# Patient Record
Sex: Female | Born: 2000 | Race: White | Hispanic: No | Marital: Single | Smoking: Never smoker
Health system: Southern US, Community
[De-identification: ages and names within clinical notes are randomized; demographics above are authoritative.]

## PROBLEM LIST (undated history)

## (undated) HISTORY — PX: TONSILLECTOMY: SUR1361

---
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
## 2022-04-04 IMAGING — MR MR HEAD WO/W CM
13 series · 48 of 48 positions shown · IV contrast (gadavist)
Comparison: None.

CLINICAL DATA: Headaches

EXAM:
MRI HEAD WITHOUT AND WITH CONTRAST
TECHNIQUE: Multiplanar, multiecho pulse sequences of the brain and surrounding
structures were obtained without and with intravenous contrast.
CONTRAST:  6mL GADAVIST GADOBUTROL 1 MMOL/ML IV SOLN

[Series 5: ax dwi_tracew · axial · 3.0mm · 0.65mm/px · z∈[-145,+6]mm · 2 of 48 slices shown]
[im 1/48]
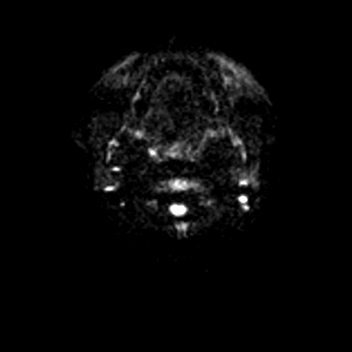
[im 48/48]
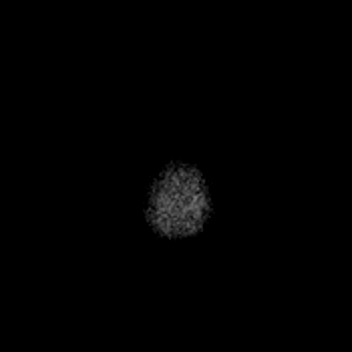

[Series 6: ax dwi_adc · axial · 3.0mm · 0.65mm/px · z∈[-145,+6]mm · 3 of 48 slices shown]
[im 1/48]
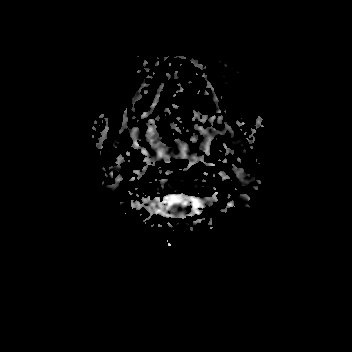
[im 24/48]
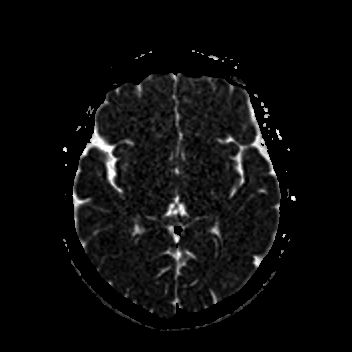
[im 48/48]
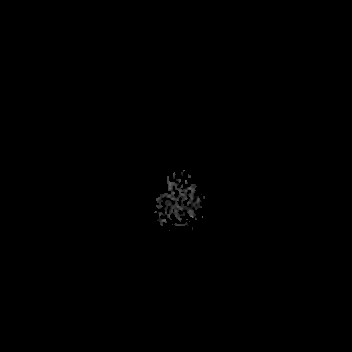

[Series 7: cor dwi_tracew · coronal · 5.0mm · 0.65mm/px · 2 of 36 slices shown]
[im 1/36]
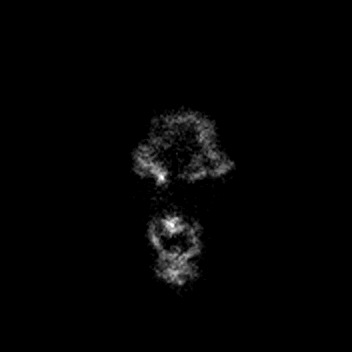
[im 36/36]
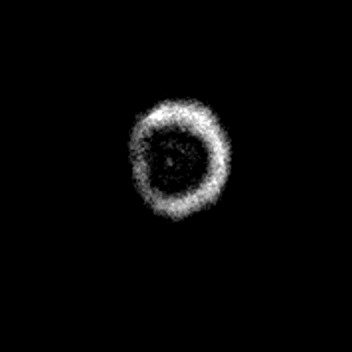

[Series 8: cor dwi_adc · coronal · 5.0mm · 0.65mm/px · 2 of 36 slices shown]
[im 1/36]
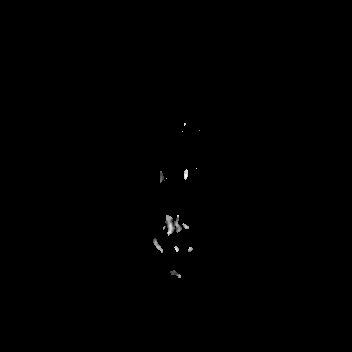
[im 36/36]
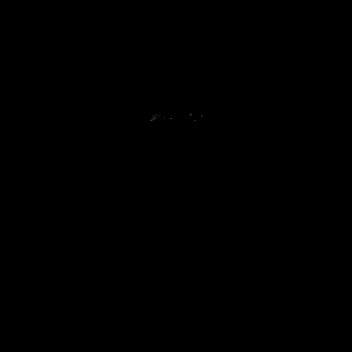

[Series 9: T1 · sagittal · 5.0mm · 0.62mm/px · 1 of 25 slices shown (1 of 2)]
[im 1/25]
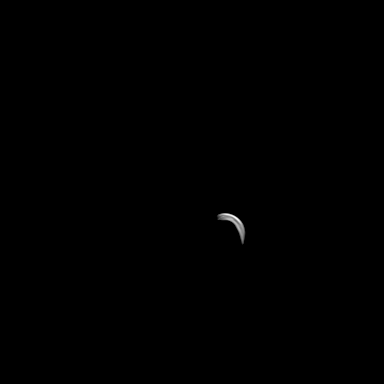

[Series 10: T2 · axial · 5.0mm · 0.53mm/px · z∈[-141,+4]mm · 2 of 26 slices shown]
[im 1/26]
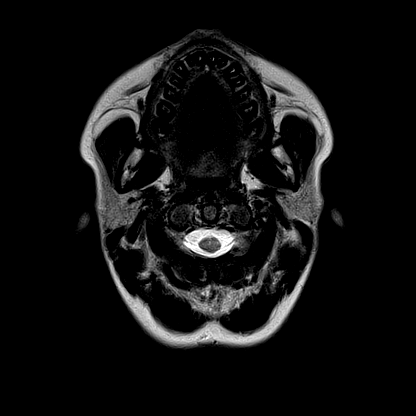
[im 26/26]
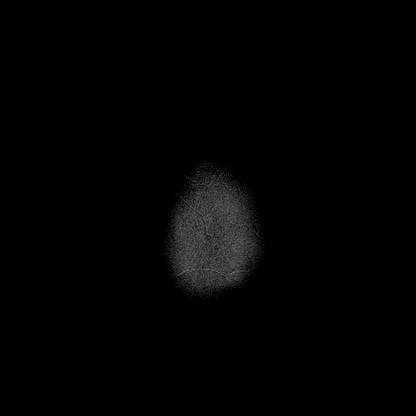

[Series 12: pha_images · axial · 3.0mm · 0.90mm/px · z∈[-155,+13]mm · 3 of 57 slices shown]
[im 1/57]
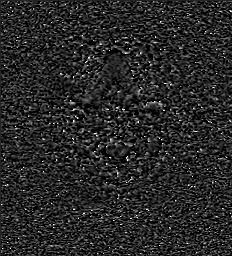
[im 29/57]
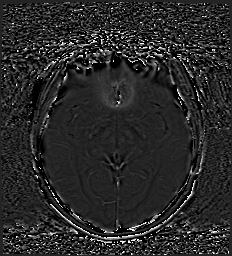
[im 57/57]
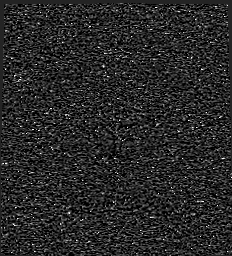

[Series 13: swi_images · axial · 3.0mm · 0.90mm/px · z∈[-155,+16]mm · 4 of 60 slices shown]
[im 1/60]
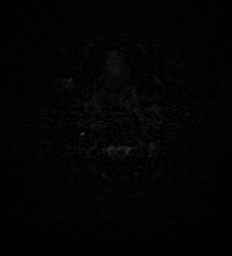
[im 20/60]
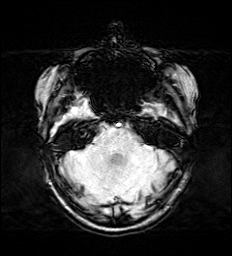
[im 40/60]
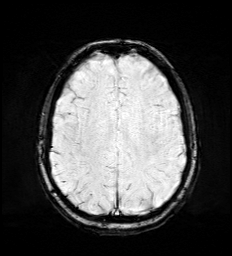
[im 60/60]
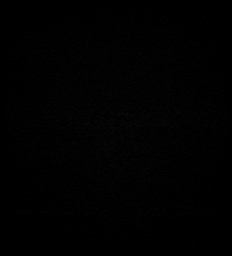

[Series 15: FLAIR · axial · 3.0mm · 0.53mm/px · z∈[-147,+10]mm · 3 of 55 slices shown]
[im 1/55]
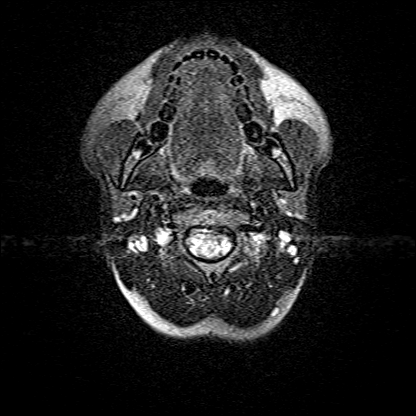
[im 28/55]
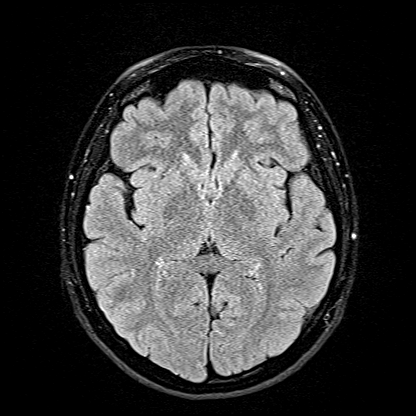
[im 55/55]
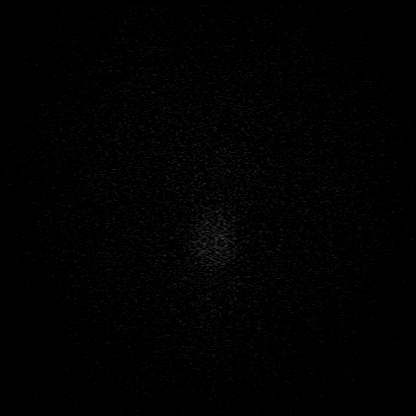

[Series 16: T1 · axial · 1.0mm · 0.98mm/px · z∈[-157,+13]mm · 11 of 176 slices shown (2 of 2)]
[im 1/176]
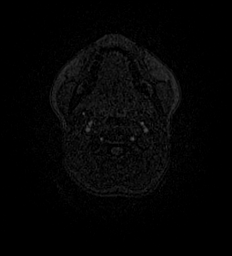
[im 18/176]
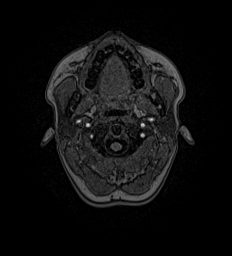
[im 36/176]
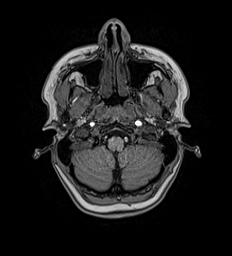
[im 53/176]
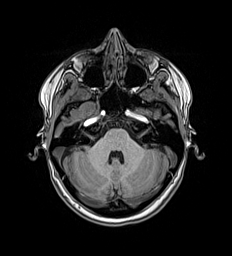
[im 71/176]
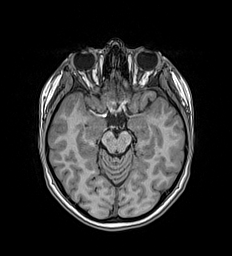
[im 88/176]
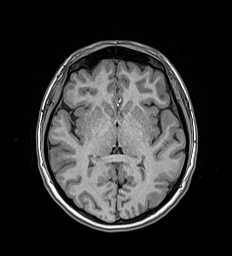
[im 106/176]
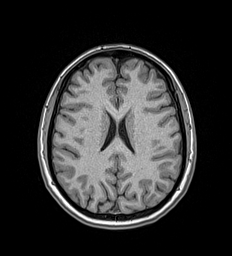
[im 123/176]
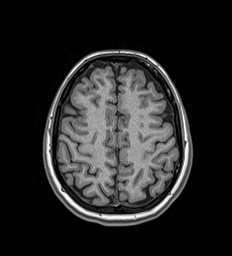
[im 141/176]
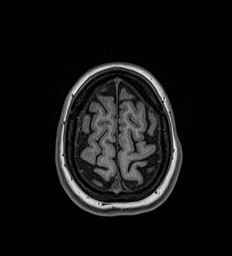
[im 158/176]
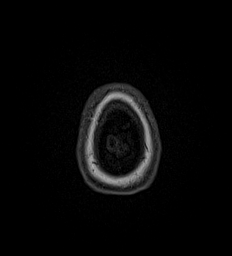
[im 176/176]
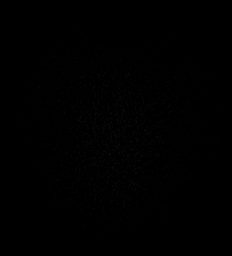

[Series 17: T2 post-contrast · coronal · 5.0mm · 0.57mm/px · 2 of 29 slices shown]
[im 1/29]
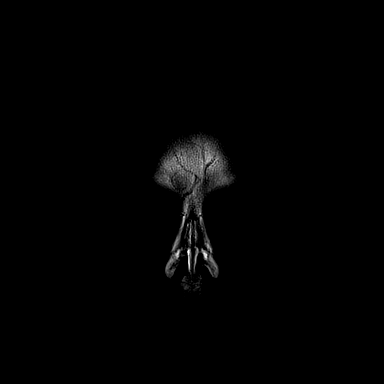
[im 29/29]
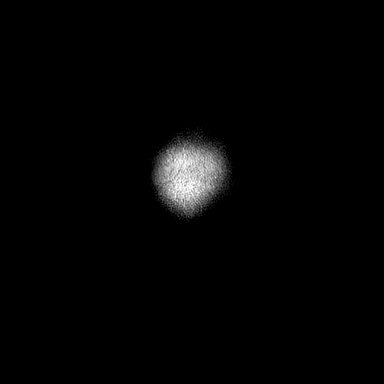

[Series 18: T1 post-contrast · axial · 1.0mm · 0.98mm/px · z∈[-157,+13]mm · 11 of 176 slices shown (1 of 2)]
[im 1/176]
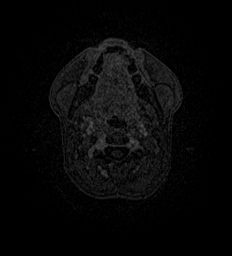
[im 18/176]
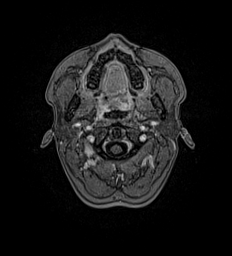
[im 36/176]
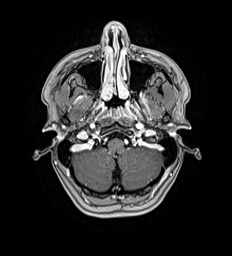
[im 53/176]
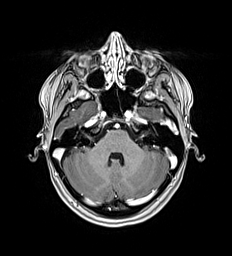
[im 71/176]
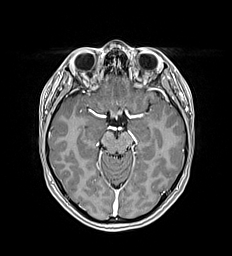
[im 88/176]
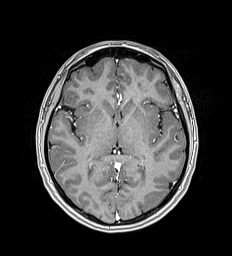
[im 106/176]
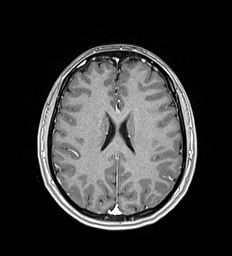
[im 123/176]
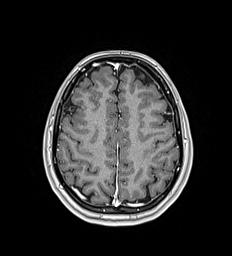
[im 141/176]
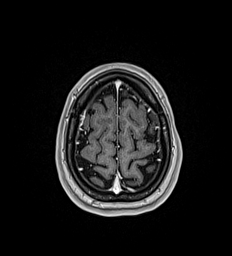
[im 158/176]
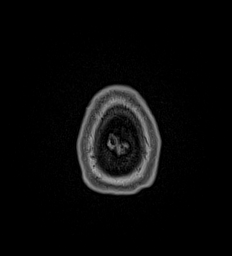
[im 176/176]
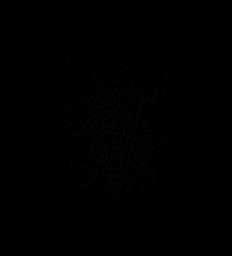

[Series 19: T1 post-contrast · coronal · 5.0mm · 0.57mm/px · 2 of 29 slices shown (2 of 2)]
[im 1/29]
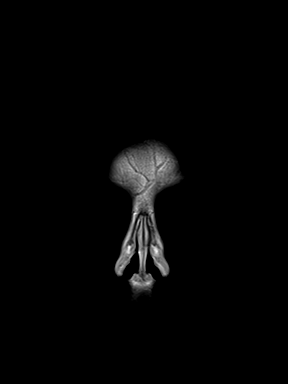
[im 29/29]
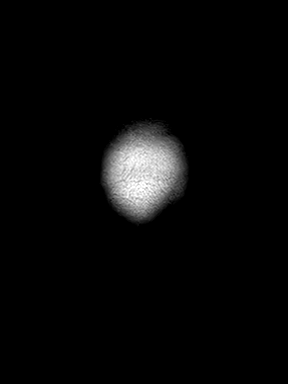

[48 of 48 positions shown; findings below may reference images not displayed]

FINDINGS: Brain: There is no acute infarction or intracranial hemorrhage.
There is no intracranial mass, mass effect, or edema. There is no
hydrocephalus or extra-axial fluid collection. Ventricles and sulci
are normal in size and configuration. No abnormal enhancement.

Vascular: Major vessel flow voids at the skull base are preserved.

Skull and upper cervical spine: Normal marrow signal is preserved.

Sinuses/Orbits: Paranasal sinuses are aerated. Orbits are
unremarkable.

Other: Sella is unremarkable.  Mastoid air cells are clear.
IMPRESSION: Normal MRI of the brain.

## 2022-11-21 ENCOUNTER — Ambulatory Visit
Admission: EM | Admit: 2022-11-21 | Discharge: 2022-11-21 | Disposition: A | Discharge status: Home / Self Care | Payer: Medicaid Other | Attending: Urgent Care | Admitting: Urgent Care

## 2022-11-21 DIAGNOSIS — N3001 Acute cystitis with hematuria: Secondary | ICD-10-CM | POA: Diagnosis not present

## 2022-11-21 LAB — POCT URINALYSIS DIP (MANUAL ENTRY)
Bilirubin, UA: NEGATIVE
Glucose, UA: NEGATIVE mg/dL
Ketones, POC UA: NEGATIVE mg/dL
Nitrite, UA: NEGATIVE
Protein Ur, POC: 100 mg/dL — AB
Spec Grav, UA: >=1.03 — AB (ref 1.010–1.025)
Urobilinogen, UA: 1 E.U./dL
pH, UA: 7 (ref 5.0–8.0)

## 2022-11-21 MED ORDER — NITROFURANTOIN MONOHYD MACRO 100 MG PO CAPS: 100.0000 mg | ORAL_CAPSULE | Freq: Two times a day (BID) | ORAL | 0 refills | Status: DC

## 2022-11-21 NOTE — ED Provider Notes (Signed)
Vicki Tucker    CSN: [id number] Arrival date & time: 11/21/22  1359      History   Chief Complaint Chief Complaint  Patient presents with   Urinary Frequency    HPI Vicki Tucker is a 22 y.o. female.    Urinary Frequency    This urgent care for urinary frequency x 2 weeks.  Treating self with Azo and cranberry juice.  Denies fever, chills, body aches.  Denies abdominal pain.  Denies flank pain.  History reviewed. No pertinent past medical history.  There are no problems to display for this patient.   Past Surgical History:  Procedure Laterality Date   TONSILLECTOMY      OB History   No obstetric history on file.      Home Medications    Prior to Admission medications   Medication Sig Start Date End Date Taking? Authorizing Provider  methocarbamol (ROBAXIN) 500 MG tablet Take 1 tablet (500 mg total) by mouth 4 (four) times daily. 09/05/20   Cuthriell, Charline Bills, PA-C    Family History History reviewed. No pertinent family history.  Social History Social History   Tobacco Use   Smoking status: Never   Smokeless tobacco: Never  Vaping Use   Vaping Use: Never used  Substance Use Topics   Alcohol use: Never   Drug use: Never     Allergies   Patient has no known allergies.   Review of Systems Review of Systems  Genitourinary:  Positive for frequency.     Physical Exam Triage Vital Signs ED Triage Vitals  Enc Vitals Group     BP 11/21/22 1434 125/75     Pulse Rate 11/21/22 1434 80     Resp 11/21/22 1434 16     Temp 11/21/22 1434 98 F (36.7 C)     Temp Source 11/21/22 1434 Temporal     SpO2 11/21/22 1434 97 %     Weight --      Height --      Head Circumference --      Peak Flow --      Pain Score 11/21/22 1433 0     Pain Loc --      Pain Edu? --      Excl. in [place]? --    No data found.  Updated Vital Signs BP 125/75 (BP Location: Left Arm)   Pulse 80   Temp 98 F (36.7 C) (Temporal)   Resp 16   LMP 11/07/2022  (Approximate)   SpO2 97%   Visual Acuity Right Eye Distance:   Left Eye Distance:   Bilateral Distance:    Right Eye Near:   Left Eye Near:    Bilateral Near:     Physical Exam Vitals reviewed.  Constitutional:      Appearance: Normal appearance.  Skin:    General: Skin is warm and dry.  Neurological:     General: No focal deficit present.     Mental Status: She is alert and oriented to person, place, and time.  Psychiatric:        Mood and Affect: Mood normal.        Behavior: Behavior normal.      UC Treatments / Results  Labs (all labs ordered are listed, but only abnormal results are displayed) Labs Reviewed  POCT URINALYSIS DIP (MANUAL ENTRY) - Abnormal; Notable for the following components:      Result Value   Color, UA light yellow (*)  Clarity, UA cloudy (*)    Spec Grav, UA >=1.030 (*)    Blood, UA moderate (*)    Protein Ur, POC =100 (*)    Leukocytes, UA Small (1+) (*)    All other components within normal limits    EKG   Radiology No results found.  Procedures Procedures (including critical care time)  Medications Ordered in UC Medications - No data to display  Initial Impression / Assessment and Plan / UC Course  I have reviewed the triage vital signs and the nursing notes.  Pertinent labs & imaging results that were available during my care of the patient were reviewed by me and considered in my medical decision making (see chart for details).   UA is consistent with UTI with small leukocytes and moderate blood.  Will treat for acute cystitis with hematuria using Macrobid.   Final Clinical Impressions(s) / UC Diagnoses   Final diagnoses:  None   Discharge Instructions   None    ED Prescriptions   None    PDMP not reviewed this encounter.   Rose Phi, [place] 11/21/22 1444

## 2022-11-21 NOTE — Discharge Instructions (Addendum)
Follow up here or with your primary care provider if your symptoms are worsening or not improving with treatment.          

## 2022-11-21 NOTE — ED Triage Notes (Signed)
Patient presents to UC for urinary freq since 2 weeks. Treating symptoms with azo and cranberry juice.

## 2023-04-24 ENCOUNTER — Ambulatory Visit
Admission: EM | Admit: 2023-04-24 | Discharge: 2023-04-24 | Disposition: A | Discharge status: Home / Self Care | Payer: Medicaid Other | Attending: Internal Medicine | Admitting: Internal Medicine

## 2023-04-24 ENCOUNTER — Ambulatory Visit (INDEPENDENT_AMBULATORY_CARE_PROVIDER_SITE_OTHER): Payer: Medicaid Other

## 2023-04-24 DIAGNOSIS — S0033XA Contusion of nose, initial encounter: Secondary | ICD-10-CM | POA: Diagnosis not present

## 2023-04-24 DIAGNOSIS — R040 Epistaxis: Secondary | ICD-10-CM | POA: Diagnosis not present
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
## 2023-04-24 NOTE — Discharge Instructions (Signed)
You may continue icing area 2-3 times a day today for 15 minutes at a time It is OK to take Ibuprofen as needed for pain
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
## 2024-01-01 ENCOUNTER — Encounter: Payer: Self-pay | Admitting: Dietician

## 2024-01-01 ENCOUNTER — Encounter: Attending: Family Medicine | Admitting: Dietician

## 2024-01-01 DIAGNOSIS — E66811 Obesity, class 1: Secondary | ICD-10-CM | POA: Insufficient documentation

## 2024-01-01 DIAGNOSIS — Z713 Dietary counseling and surveillance: Secondary | ICD-10-CM | POA: Insufficient documentation

## 2024-01-01 NOTE — Progress Notes (Signed)
 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  Medications: No current outpatient medications on file.  Labs: No results found for: "HGBA1C" No results found for: "CHOL", "HDL", "LDLCALC", "LDLDIRECT", "TRIG", "CHOLHDL"  Notable Signs/Symptoms: none  Lifestyle & Dietary Hx Pt presents today alone. Pt reports she desires weight reductions to a lower BMI range. Pt reports she has tried intermittent fastin gained would lose motivation after a period of time. Pt reports she works part time and goes to school full time. Pt c/o daytime sleepiness and states she has been told she snores. Pt reports she is panning her wedding for September 2025. Pt reports she lives with her fiance and they shared shopping and her fiance does the most of the cooking. Pt reports her appetite is not well controlled.  All Pt's questions were answered during this encounter.   Estimated daily fluid intake: "not enough" ~ 20 oz Supplements: vitamin C  Sleep: good 7-8 hours on average; c/o daytime sleepiness Stress / self-care: 8 out of 10 / self care includes: time with fiance, time alone listening to music or reading Current average weekly physical activity: walk on campus, walk on walk averaging  steps daily ~5K   24-Hr Dietary Recall First Meal: nutri grain bar, coffee with flavored SF creamer and SF vanilla syrup Snack: chips, lil debbie fudge rounds, cheerwine Second Meal: 6 pieces nugget, fries, pineapple dragon fruit sweet tea or leftovers or grilled chicken salad with dressing Snack: none Third Meal: bell peppers stuffed with beef, cheese, onions, rice, water Snack: frozen yogurt with berries with protein powder Beverages:  water, coffee with flavored SF creamer and SF vanilla syrup, SF cheerwine, pineapple dragon fruit sweet tea, Alani energy drinks  NUTRITION DIAGNOSIS  NB-1.1 Food and nutrition-related knowledge deficit As related to prior nutrition related education.  As evidenced by Pt reports and dietary recall.   NUTRITION INTERVENTION  Nutrition education (E-1) on the following topics:  Fruits & Vegetables: Aim to fill half your plate with a variety of fruits and vegetables. They are rich in vitamins, minerals, and fiber, and can help reduce the risk of chronic diseases. Choose a colorful assortment of fruits and vegetables to ensure you get a wide range of nutrients. Grains and Starches: Make at least half of your grain choices whole grains, such as brown rice, whole wheat bread, and oats. Whole grains provide fiber, which aids in digestion and healthy cholesterol levels. Aim for whole forms of starchy vegetables such as potatoes, sweet potatoes, beans, peas, and corn, which are fiber rich and provide many vitamins and minerals.  Protein: Incorporate lean sources of protein, such as poultry, fish, beans, nuts, and seeds, into your meals. Protein is essential for building and repairing tissues, staying full, balancing blood sugar, as well as supporting immune function. Dairy: Include low-fat or fat-free dairy products like milk, yogurt, and cheese in your diet. Dairy foods are excellent sources of calcium and vitamin D, which are crucial for bone health.  Physical Activity: Aim for 60 minutes of physical activity daily. Regular physical activity promotes overall health-including helping to reduce risk for heart disease and diabetes, promoting mental health, and helping [place] sleep better.   Handouts Provided Include  Plate  Planner ([employer]) Move Your way-[facility] Balanced Snacks  Learning Style & Readiness for Change Teaching method utilized: Visual & Auditory  Demonstrated degree of understanding via: Teach Back   Barriers to learning/adherence to lifestyle change: time management  Goals Established by Pt Avoid skipping meals -Aim for balanced meals and snacks  MONITORING & EVALUATION Dietary intake, weekly physical activity, and sleep.  Next Steps  Patient is to return 1-2 months
